# Patient Record
Sex: Male | Born: 2000
Health system: Southern US, Community
[De-identification: ages and names within clinical notes are randomized; demographics above are authoritative.]

## PROBLEM LIST (undated history)

## (undated) DIAGNOSIS — J45909 Unspecified asthma, uncomplicated: Secondary | ICD-10-CM

## (undated) HISTORY — PX: CIRCUMCISION: SUR203

---
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
## 2018-01-27 IMAGING — DX DG HAND COMPLETE 3+V*R*
3 series · 3 of 3 positions shown · non-contrast
Comparison: None.

CLINICAL DATA: Pain and swelling after punching wall

EXAM:
RIGHT HAND - COMPLETE 3+ VIEW

[hand pa]
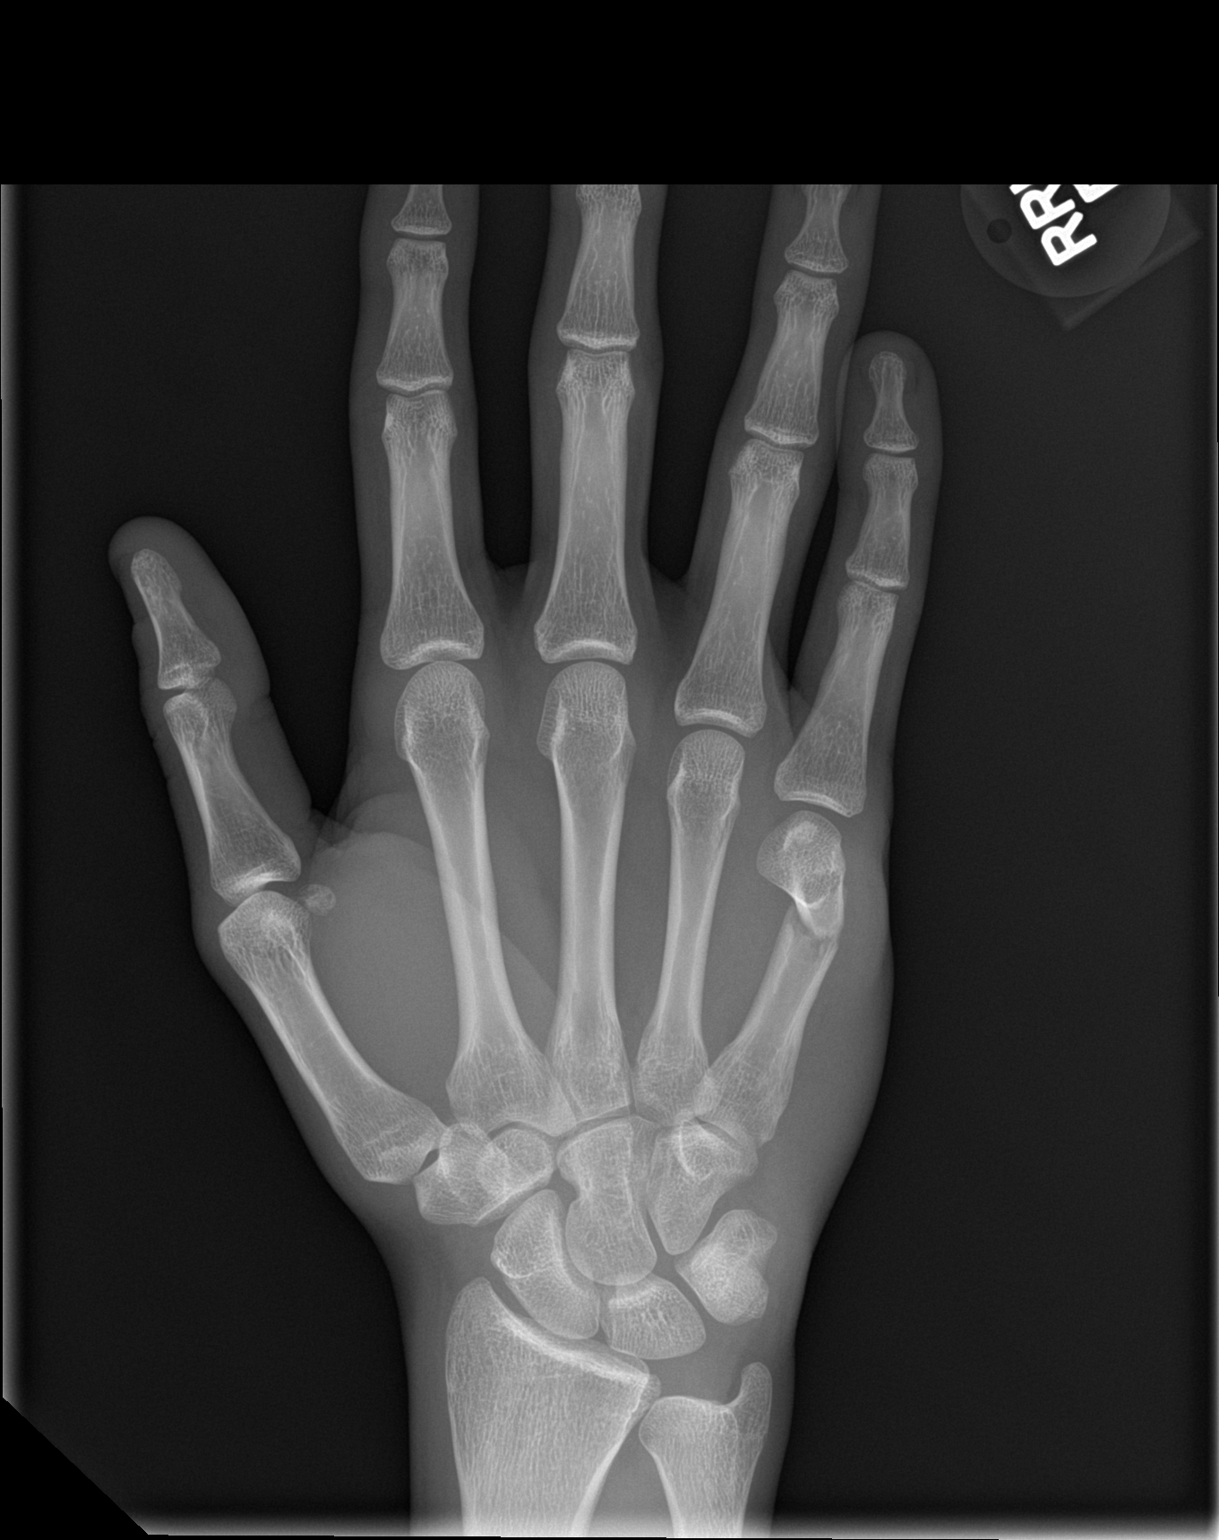

[hand obl]
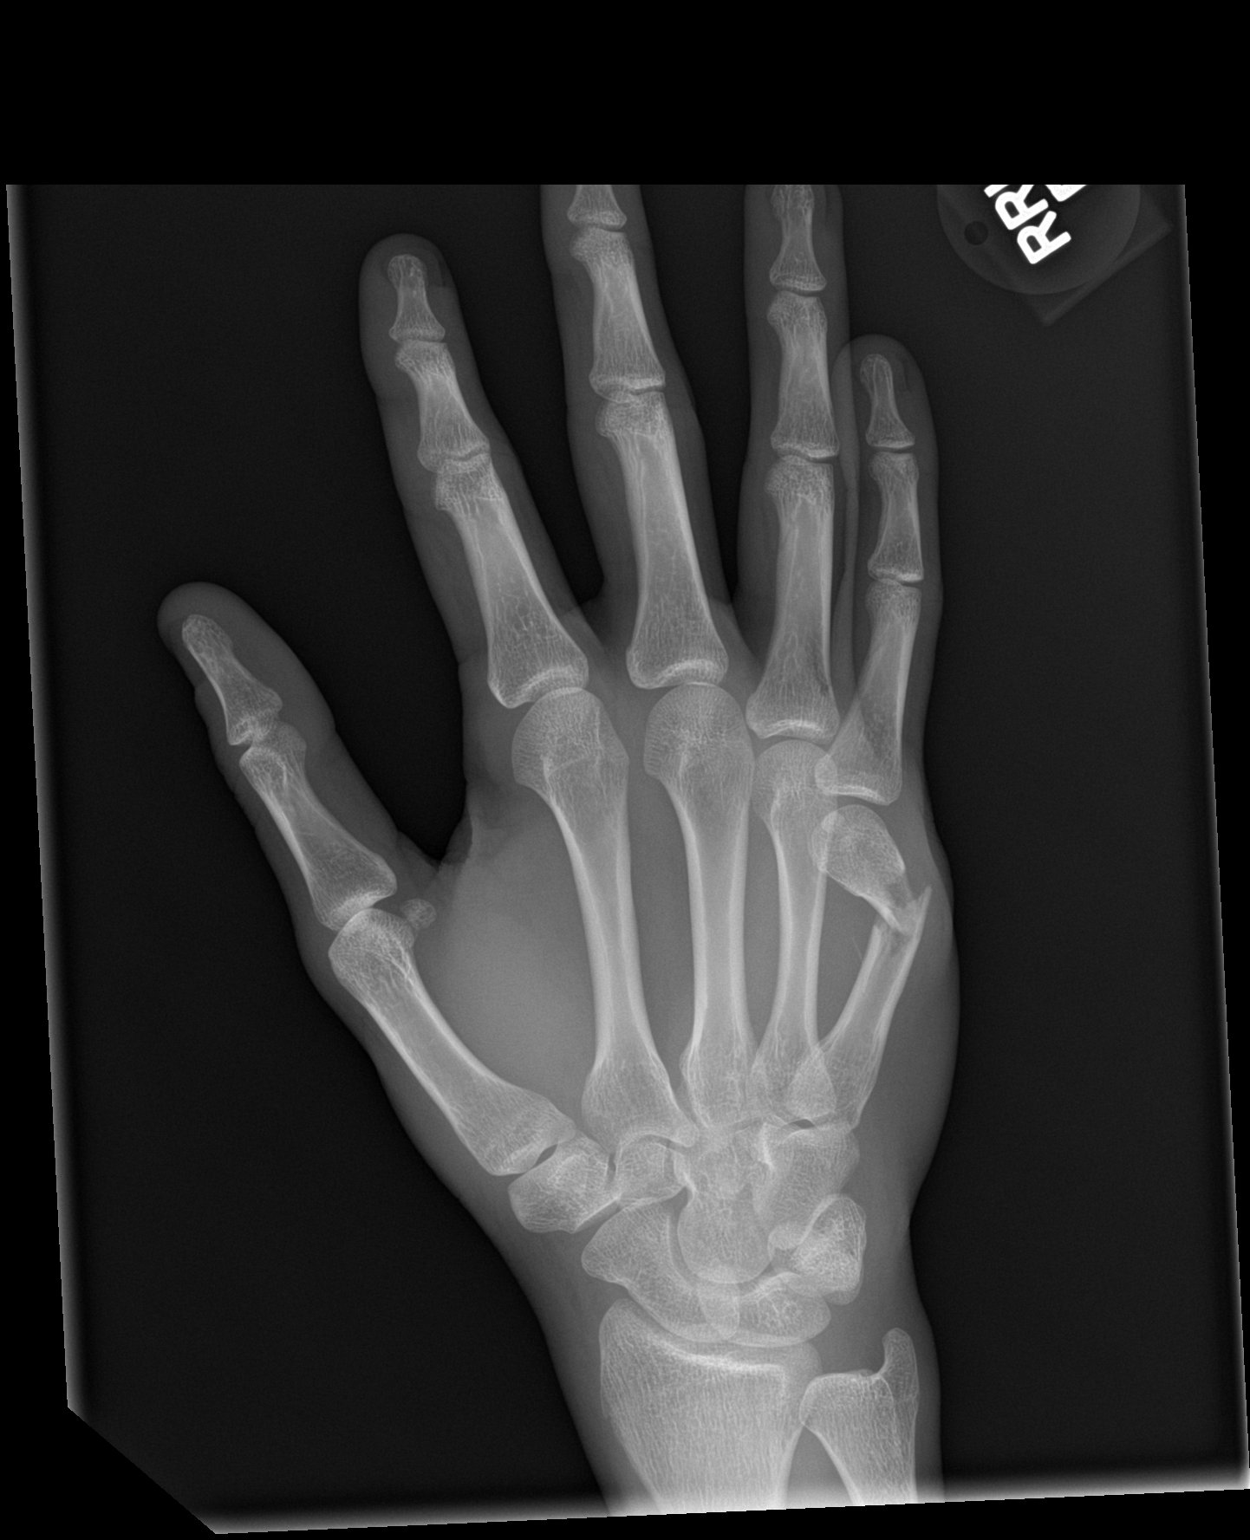

[hand lat]
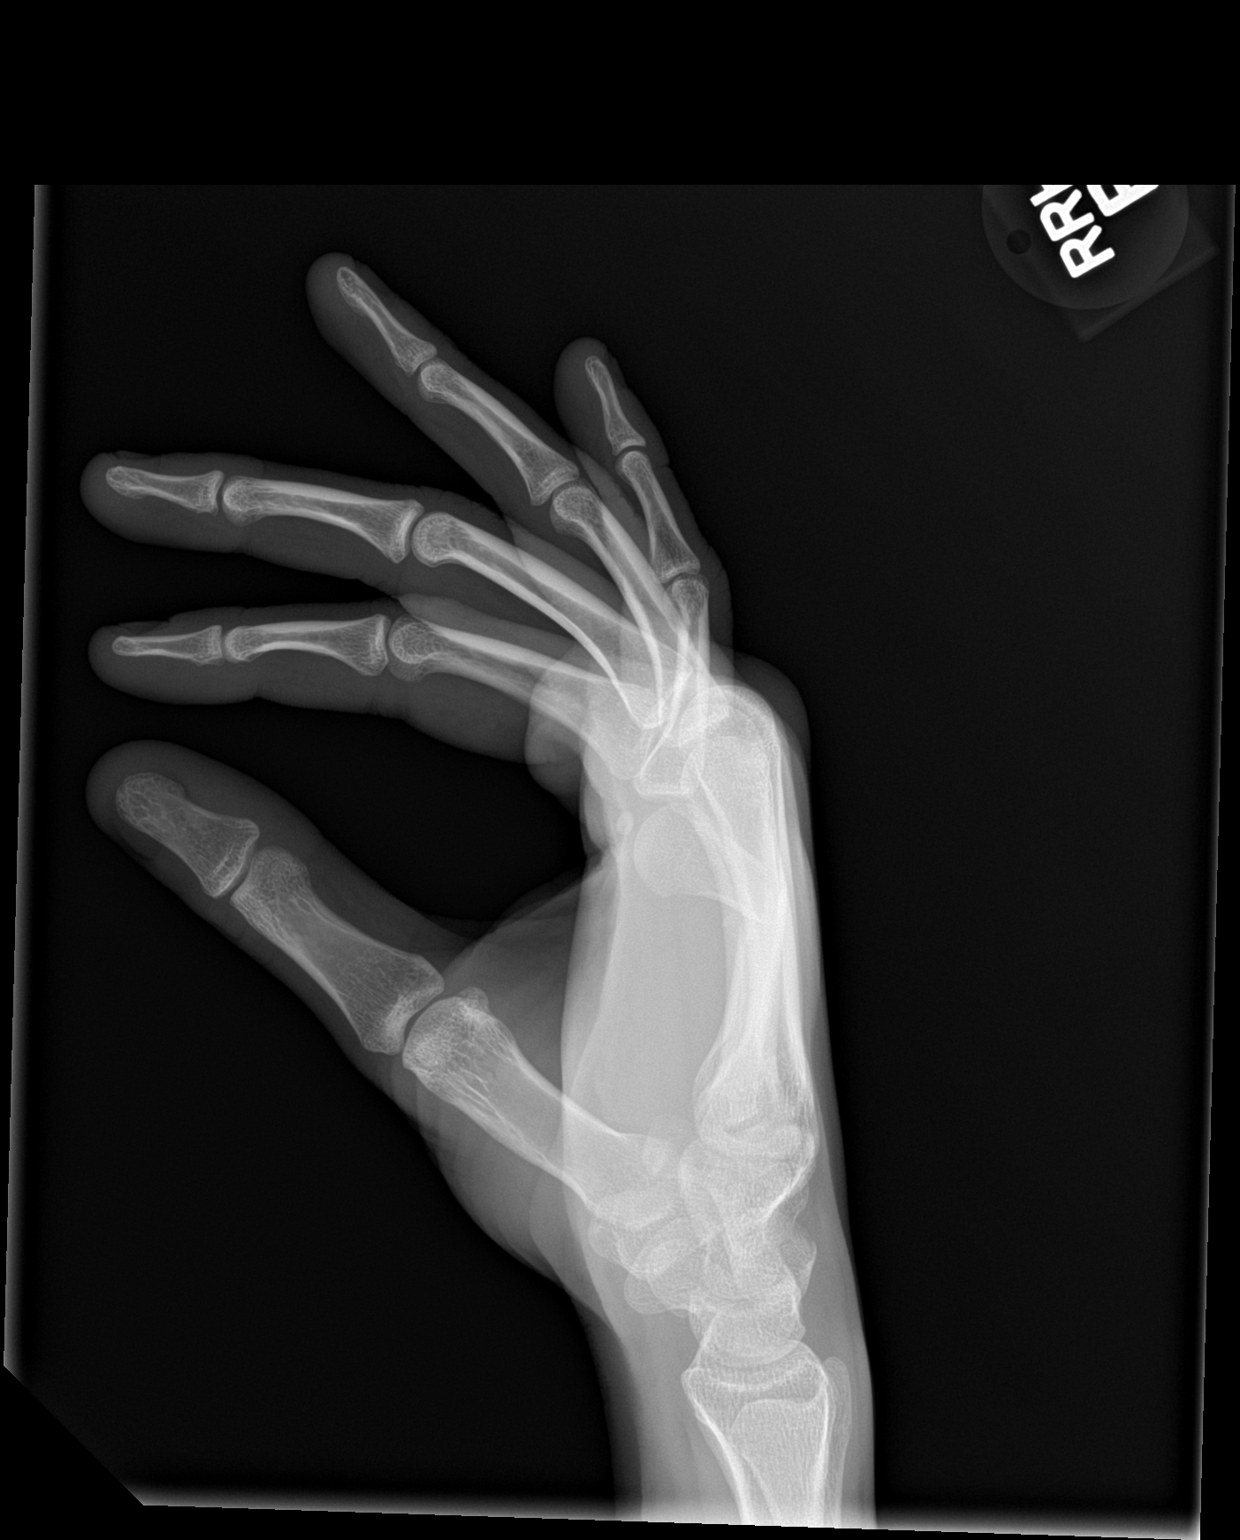

[3 of 3 positions shown; findings below may reference images not displayed]

FINDINGS: Frontal, oblique and lateral views were obtained. There is a
fracture of the distal fifth metacarpal diaphysis with volar
angulation distally. No other fractures. No dislocations. The joint
spaces appear normal. No erosive change.
IMPRESSION: Fracture distal fifth metacarpal with volar angulation distally. No
other fracture. No dislocation. No apparent arthropathy.

## 2018-09-27 DIAGNOSIS — Z0001 Encounter for general adult medical examination with abnormal findings: Secondary | ICD-10-CM | POA: Diagnosis not present

## 2018-09-27 DIAGNOSIS — Z1389 Encounter for screening for other disorder: Secondary | ICD-10-CM | POA: Diagnosis not present

## 2018-09-27 DIAGNOSIS — R634 Abnormal weight loss: Secondary | ICD-10-CM | POA: Diagnosis not present

## 2018-09-27 DIAGNOSIS — Z713 Dietary counseling and surveillance: Secondary | ICD-10-CM | POA: Diagnosis not present

## 2021-02-17 ENCOUNTER — Encounter: Payer: Self-pay | Admitting: Emergency Medicine

## 2021-02-17 ENCOUNTER — Other Ambulatory Visit: Payer: Self-pay

## 2021-02-17 ENCOUNTER — Ambulatory Visit
Admission: EM | Admit: 2021-02-17 | Discharge: 2021-02-17 | Disposition: A | Discharge status: Home / Self Care | Payer: 59 | Attending: Family Medicine | Admitting: Family Medicine

## 2021-02-17 DIAGNOSIS — D2322 Other benign neoplasm of skin of left ear and external auricular canal: Secondary | ICD-10-CM | POA: Diagnosis not present
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
## 2021-02-25 ENCOUNTER — Encounter: Payer: Self-pay | Admitting: Nurse Practitioner

## 2021-02-25 ENCOUNTER — Other Ambulatory Visit: Payer: Self-pay

## 2021-02-25 ENCOUNTER — Ambulatory Visit (INDEPENDENT_AMBULATORY_CARE_PROVIDER_SITE_OTHER): Payer: 59 | Admitting: Nurse Practitioner

## 2021-02-25 VITALS — BP 130/84 | Resp 16 | Ht 73.75 in | Wt 136.4 lb

## 2021-02-25 DIAGNOSIS — H9392 Unspecified disorder of left ear: Secondary | ICD-10-CM | POA: Diagnosis not present

## 2021-02-25 DIAGNOSIS — Z139 Encounter for screening, unspecified: Secondary | ICD-10-CM | POA: Diagnosis not present

## 2021-02-25 DIAGNOSIS — Z0000 Encounter for general adult medical examination without abnormal findings: Secondary | ICD-10-CM | POA: Insufficient documentation

## 2021-02-25 DIAGNOSIS — Z7689 Persons encountering health services in other specified circumstances: Secondary | ICD-10-CM

## 2021-02-25 NOTE — Progress Notes (Signed)
New Patient Office Visit  Subjective:  Patient ID: Frank Dickerson, male    DOB: 03/12/2001  Age: 19 y.o. MRN: [id number]  CC:  Chief Complaint  Patient presents with  . Establish Care  . Mass    Dime sized bump behind left ear. Sometimes it is sore if he sleeps on it. Got big as a ping pong ball last week and popped with pus. Still feels like something is in there     HPI Frank Dickerson presents for left ear lesion. He needs referral to ENT so he can go to his appt on 03/07/21. He had mass behind his ear that started draining, and he states it had thick pus and some blood.   No recent PCP. No recent labs or physical.     Past Medical History:  Diagnosis Date  . Asthma    childhood    Past Surgical History:  Procedure Laterality Date  . CIRCUMCISION      Family History  Problem Relation Age of Onset  . Migraines Mother   . Bipolar disorder Sister   . ADD / ADHD Sister   . ADD / ADHD Brother     Social History   Socioeconomic History  . Marital status: Married    Spouse name: Not on file  . Number of children: Not on file  . Years of education: Not on file  . Highest education level: Not on file  Occupational History  . Occupation: Unemployed  Tobacco Use  . Smoking status: Current Every Day Smoker    Types: E-cigarettes  . Smokeless tobacco: Never Used  Vaping Use  . Vaping Use: Every day  . Substances: Nicotine, Flavoring  Substance and Sexual Activity  . Alcohol use: No  . Drug use: No  . Sexual activity: Yes    Birth control/protection: None    Comment: wife is currently pregnant  Other Topics Concern  . Not on file  Social History Narrative  . Not on file   Social Determinants of Health   Financial Resource Strain: Not on file  Food Insecurity: Not on file  Transportation Needs: Not on file  Physical Activity: Not on file  Stress: Not on file  Social Connections: Not on file  Intimate Partner Violence: Not on file     ROS Review of Systems  Constitutional: Negative.   HENT: Positive for ear pain.        Left ear lesion  Respiratory: Negative.   Cardiovascular: Negative.   Psychiatric/Behavioral: Negative.     Objective:   Today's Vitals: BP 130/84   Resp 16   Ht 6' 1.75" (1.873 m)   Wt 136 lb 6.4 oz (61.9 kg)   SpO2 99%   BMI 17.63 kg/m   Physical Exam Constitutional:      Appearance: Normal appearance.  Cardiovascular:     Rate and Rhythm: Normal rate and regular rhythm.     Pulses: Normal pulses.     Heart sounds: Normal heart sounds.  Pulmonary:     Effort: Pulmonary effort is normal.     Breath sounds: Normal breath sounds.  Skin:    General: Skin is warm and dry.     Findings: Lesion present.     Comments: Erythematous lesion to posterior of left ear. Since it has been drained, difficult to determine if this was abscess or sebaceous cyst.   Neurological:     Mental Status: He is alert.     Assessment & Plan:  Problem List Items Addressed This Visit      Nervous and Auditory   Lesion of left ear    -has been drained; unsure if abscess, sebaceous cyst, or other lesion -has ENT appointment on 03/07/21, but he states he needs referral for this -will grant that request      Relevant Orders   Ambulatory referral to ENT     Other   Encounter to establish care    -no recent PCP, physical, or labs      Relevant Orders   CBC with Differential/Platelet   CMP14+EGFR   HCV Ab w/Rflx to Verification   HIV Antibody (routine testing w rflx)   Lipid Panel With LDL/HDL Ratio    Other Visit Diagnoses    Screening due    -  Primary   Relevant Orders   HCV Ab w/Rflx to Verification   HIV Antibody (routine testing w rflx)      Outpatient Encounter Medications as of 02/25/2021  Medication Sig  . [DISCONTINUED] HYDROcodone-acetaminophen (NORCO/VICODIN) 5-325 MG tablet Take 1 tablet by mouth every 4 (four) hours as needed. (Patient not taking: Reported on 02/25/2021)  .  [DISCONTINUED] ibuprofen (ADVIL,MOTRIN) 600 MG tablet Take 1 tablet (600 mg total) by mouth every 6 (six) hours as needed.   No facility-administered encounter medications on file as of 02/25/2021.    Follow-up: Return in about 1 month (around 03/28/2021) for Physical Exam.   Noreene Larsson, NP

## 2021-02-25 NOTE — Assessment & Plan Note (Signed)
-  has been drained; unsure if abscess, sebaceous cyst, or other lesion -has ENT appointment on 03/07/21, but he states he needs referral for this -will grant that request

## 2021-02-25 NOTE — Assessment & Plan Note (Signed)
-  no recent PCP, physical, or labs

## 2021-02-25 NOTE — Patient Instructions (Signed)
Please have fasting labs drawn 2-3 days prior to your appointment so we can discuss lab results during your office visit.

## 2021-03-19 ENCOUNTER — Encounter (INDEPENDENT_AMBULATORY_CARE_PROVIDER_SITE_OTHER): Payer: Self-pay | Admitting: Otolaryngology

## 2021-03-19 ENCOUNTER — Ambulatory Visit (INDEPENDENT_AMBULATORY_CARE_PROVIDER_SITE_OTHER): Payer: 59 | Admitting: Otolaryngology

## 2021-03-19 ENCOUNTER — Other Ambulatory Visit: Payer: Self-pay

## 2021-03-19 VITALS — Temp 97.2°F

## 2021-03-19 DIAGNOSIS — L723 Sebaceous cyst: Secondary | ICD-10-CM | POA: Diagnosis not present

## 2021-03-19 NOTE — Progress Notes (Signed)
HPI: Frank Dickerson is a 20 y.o. male who presents is referred by ED for evaluation of left postauricular cyst.  Apparently patient was seen in the ED about a month ago because of an infected cyst behind his left ear.  He was not treated with antibiotics.  But apparently it did rupture and is doing better presently.  It is not tender..  Past Medical History:  Diagnosis Date  . Asthma    childhood   Past Surgical History:  Procedure Laterality Date  . CIRCUMCISION     Social History   Socioeconomic History  . Marital status: Married    Spouse name: Not on file  . Number of children: Not on file  . Years of education: Not on file  . Highest education level: Not on file  Occupational History  . Occupation: Unemployed  Tobacco Use  . Smoking status: Current Every Day Smoker    Types: E-cigarettes  . Smokeless tobacco: Never Used  Vaping Use  . Vaping Use: Every day  . Substances: Nicotine, Flavoring  Substance and Sexual Activity  . Alcohol use: No  . Drug use: No  . Sexual activity: Yes    Birth control/protection: None    Comment: wife is currently pregnant  Other Topics Concern  . Not on file  Social History Narrative  . Not on file   Social Determinants of Health   Financial Resource Strain: Not on file  Food Insecurity: Not on file  Transportation Needs: Not on file  Physical Activity: Not on file  Stress: Not on file  Social Connections: Not on file   Family History  Problem Relation Age of Onset  . Migraines Mother   . Bipolar disorder Sister   . ADD / ADHD Sister   . ADD / ADHD Brother    Allergies  Allergen Reactions  . Other     Seasonal Allergies   Prior to Admission medications   Not on File     Positive ROS: Otherwise negative  All other systems have been reviewed and were otherwise negative with the exception of those mentioned in the HPI and as above.  Physical Exam: Constitutional: Alert, well-appearing, no acute distress Ears:  External ears without lesions or tenderness. Ear canals are clear bilaterally with intact, clear TMs.  Behind the left ear patient has a small approximate 8 mm cyst in the crease behind the left ear.  This is soft to palpation and not inflamed or swollen.  He has another smaller 1 mm cyst little bit posterior to this that apparently is new according to his wife.  Presently the cyst is soft and not inflamed. Nasal: External nose without lesions. Clear nasal passages Oral: Lips and gums without lesions. Tongue and palate mucosa without lesions. Posterior oropharynx clear. Neck: No palpable adenopathy or masses Respiratory: Breathing comfortably  Skin: No facial/neck lesions or rash noted.  Procedures  Assessment: Postauricular sebaceous cyst  Plan: Discussed with patient that as long as he does not rupture the cyst it will not cause any problems.  If he gets infected where it hurts and comes erythematous I gave him a prescription for either doxycycline 100 mg twice daily or Keflex 500 mg 3 times daily to take. Presently it is not causing him any problems however it enlarges or gets infected again consider possible excision under local anesthesia although this will have to be performed in the OR.   Radene Journey, MD   CC:

## 2021-03-28 ENCOUNTER — Encounter: Payer: 59 | Admitting: Nurse Practitioner

## 2021-04-11 ENCOUNTER — Other Ambulatory Visit: Payer: Self-pay

## 2021-04-11 ENCOUNTER — Encounter: Payer: Self-pay | Admitting: Nurse Practitioner

## 2021-04-11 ENCOUNTER — Ambulatory Visit (INDEPENDENT_AMBULATORY_CARE_PROVIDER_SITE_OTHER): Payer: 59 | Admitting: Nurse Practitioner

## 2021-04-11 VITALS — BP 139/77 | HR 75 | Temp 98.8°F | Resp 18 | Ht 74.0 in | Wt 140.0 lb

## 2021-04-11 DIAGNOSIS — Z0000 Encounter for general adult medical examination without abnormal findings: Secondary | ICD-10-CM

## 2021-04-11 DIAGNOSIS — Z139 Encounter for screening, unspecified: Secondary | ICD-10-CM | POA: Diagnosis not present

## 2021-04-11 NOTE — Assessment & Plan Note (Signed)
-  PE today was unremarkable

## 2021-04-11 NOTE — Assessment & Plan Note (Signed)
-  will screen for HCV with next labs

## 2021-04-11 NOTE — Progress Notes (Signed)
Established Patient Office Visit  Subjective:  Patient ID: Frank Dickerson, male    DOB: Apr 13, 2001  Age: 20 y.o. MRN: [id number]  CC:  Chief Complaint  Patient presents with  . Annual Exam    Needs fasting labs next week.     HPI Frank Dickerson presents for physical exam. At last OV, he has a cyst to his ear, and he saw ENT who prescribed abx if he has future infections, and discussed possible surgery in the future if the cyst gets larger or infected.  Past Medical History:  Diagnosis Date  . Asthma    childhood  . Migraine without aura and without status migrainosus, not intractable 04/11/2014    Past Surgical History:  Procedure Laterality Date  . CIRCUMCISION      Family History  Problem Relation Age of Onset  . Migraines Mother   . Bipolar disorder Sister   . ADD / ADHD Sister   . ADD / ADHD Brother     Social History   Socioeconomic History  . Marital status: Married    Spouse name: Not on file  . Number of children: Not on file  . Years of education: Not on file  . Highest education level: Not on file  Occupational History  . Occupation: Unemployed  Tobacco Use  . Smoking status: Current Every Day Smoker    Types: E-cigarettes  . Smokeless tobacco: Never Used  Vaping Use  . Vaping Use: Every day  . Substances: Nicotine, Flavoring  Substance and Sexual Activity  . Alcohol use: No  . Drug use: No  . Sexual activity: Yes    Birth control/protection: None    Comment: wife is currently pregnant  Other Topics Concern  . Not on file  Social History Narrative  . Not on file   Social Determinants of Health   Financial Resource Strain: Not on file  Food Insecurity: Not on file  Transportation Needs: Not on file  Physical Activity: Not on file  Stress: Not on file  Social Connections: Not on file  Intimate Partner Violence: Not on file    No outpatient medications prior to visit.   No facility-administered medications prior to visit.     Allergies  Allergen Reactions  . Other     Seasonal Allergies    ROS Review of Systems  Constitutional: Negative.   HENT: Negative.   Eyes: Negative.   Respiratory: Negative.   Cardiovascular: Negative.   Gastrointestinal: Negative.   Endocrine: Negative.   Genitourinary: Negative.   Musculoskeletal: Negative.   Allergic/Immunologic: Negative.   Neurological: Negative.   Hematological: Negative.   Psychiatric/Behavioral: Negative.       Objective:    Physical Exam Constitutional:      Appearance: Normal appearance.  HENT:     Head: Normocephalic and atraumatic.     Right Ear: Ear canal and external ear normal.     Left Ear: Ear canal and external ear normal.     Ears:     Comments: Has bilateral TM scarring    Nose: Nose normal.     Mouth/Throat:     Mouth: Mucous membranes are moist.     Pharynx: Oropharynx is clear.  Eyes:     Extraocular Movements: Extraocular movements intact.     Conjunctiva/sclera: Conjunctivae normal.     Pupils: Pupils are equal, round, and reactive to light.  Cardiovascular:     Rate and Rhythm: Normal rate and regular rhythm.  Pulses: Normal pulses.     Heart sounds: Normal heart sounds.  Pulmonary:     Effort: Pulmonary effort is normal.     Breath sounds: Normal breath sounds.  Abdominal:     General: Abdomen is flat. Bowel sounds are normal.     Palpations: Abdomen is soft.  Musculoskeletal:        General: Normal range of motion.     Cervical back: Normal range of motion and neck supple.  Skin:    General: Skin is warm and dry.     Capillary Refill: Capillary refill takes less than 2 seconds.  Neurological:     General: No focal deficit present.     Mental Status: He is alert and oriented to person, place, and time.     Cranial Nerves: No cranial nerve deficit.     Sensory: No sensory deficit.     Motor: No weakness.     Coordination: Coordination normal.     Gait: Gait normal.  Psychiatric:        Mood and  Affect: Mood normal.        Behavior: Behavior normal.        Thought Content: Thought content normal.        Judgment: Judgment normal.     BP 139/77   Pulse 75   Temp 98.8 F (37.1 C)   Resp 18   Ht _0  (1.88 m)   Wt 140 lb (63.5 kg)   SpO2 98%   BMI 17.97 kg/m  Wt Readings from Last 3 Encounters:  04/11/21 140 lb (63.5 kg)  02/25/21 136 lb 6.4 oz (61.9 kg)  01/18/17 135 lb (61.2 kg) (35 %, Z= -0.38)*   * Growth percentiles are based on CDC (Boys, 2-20 Years) data.     Health Maintenance Due  Topic Date Due  . HIV Screening  Never done  . Hepatitis C Screening  Never done    There are no preventive care reminders to display for this patient.  No results found for: TSH No results found for: WBC, HGB, HCT, MCV, PLT No results found for: NA, K, CHLORIDE, CO2, GLUCOSE, BUN, CREATININE, BILITOT, ALKPHOS, AST, ALT, PROT, ALBUMIN, CALCIUM, ANIONGAP, EGFR, GFR No results found for: CHOL No results found for: HDL No results found for: LDLCALC No results found for: TRIG No results found for: CHOLHDL No results found for: HGBA1C    Assessment & Plan:   Problem List Items Addressed This Visit      Other   Routine medical exam    -PE today was unremarkable      Screening due - Primary    -will screen for HCV with next labs      Relevant Orders   Hepatitis C Antibody      No orders of the defined types were placed in this encounter.   Follow-up: Return in about 1 year (around 04/11/2022) for Physical Exam (same day fasting labs).    Noreene Larsson, NP

## 2021-04-11 NOTE — Patient Instructions (Signed)
Please have fasting labs drawn this week.  For the physical in a year, we will get same day fasting labs.

## 2021-05-17 DIAGNOSIS — M25561 Pain in right knee: Secondary | ICD-10-CM | POA: Diagnosis not present

## 2022-04-17 ENCOUNTER — Encounter: Payer: 59 | Admitting: Nurse Practitioner

## 2024-02-29 DIAGNOSIS — B070 Plantar wart: Secondary | ICD-10-CM | POA: Diagnosis not present

## 2024-02-29 DIAGNOSIS — Q666 Other congenital valgus deformities of feet: Secondary | ICD-10-CM | POA: Diagnosis not present

## 2024-02-29 DIAGNOSIS — Z681 Body mass index (BMI) 19 or less, adult: Secondary | ICD-10-CM | POA: Diagnosis not present

## 2024-03-01 ENCOUNTER — Ambulatory Visit: Admitting: Podiatry

## 2024-03-03 ENCOUNTER — Ambulatory Visit (INDEPENDENT_AMBULATORY_CARE_PROVIDER_SITE_OTHER): Admitting: Podiatry

## 2024-03-03 ENCOUNTER — Encounter: Payer: Self-pay | Admitting: Podiatry

## 2024-03-03 DIAGNOSIS — D492 Neoplasm of unspecified behavior of bone, soft tissue, and skin: Secondary | ICD-10-CM

## 2024-03-03 DIAGNOSIS — D2371 Other benign neoplasm of skin of right lower limb, including hip: Secondary | ICD-10-CM

## 2024-03-03 NOTE — Progress Notes (Signed)
 Subjective:   Patient ID: Frank Dickerson, male   DOB: 23 y.o.   MRN: [id number]   HPI Chief Complaint  Patient presents with   Plantar Warts    RM#13 Right foot wart present for 4-6 weeks causing pain.   23 year old male presents the office with concerns of a skin lesion, wart on the right foot present present for the last 4 to 6 weeks.  He did go to urgent care for this as well.  No recent treatment.  No injuries.  Does not recall stepping any foreign objects.   Review of Systems  All other systems reviewed and are negative.  Past Medical History:  Diagnosis Date   Asthma    childhood   Migraine without aura and without status migrainosus, not intractable 04/11/2014    Past Surgical History:  Procedure Laterality Date   CIRCUMCISION      No current outpatient medications on file.  Allergies  Allergen Reactions   Other     Seasonal Allergies          Objective:  Physical Exam  General: AAO x3, NAD  Dermatological: Punctate annual hyperkeratotic lesion noted on the plantar aspect of the right foot near the heel. There is a punctate, annular hyperkeratotic lesion without any underlying ulceration, drainage, or signs of infection. No foreign body noted.   Vascular: Dorsalis Pedis artery and Posterior Tibial artery pedal pulses are 2/4 bilateral with immedate capillary fill time.  There is no pain with calf compression, swelling, warmth, erythema.   Neruologic: Grossly intact via light touch bilateral.   Musculoskeletal:  Tenderness to palpation on the skin lesion. No other areas of tenderness.   Gait: Unassisted, Nonantalgic.       Assessment:   Skin lesion right foot     Plan:  -Treatment options discussed including all alternatives, risks, and complications -Etiology of symptoms were discussed -Sharply debrided the skin lesion without any complications or bleeding. I cleaned the skin with alcohol. Canthrone applied followed by an occlusive bandage.  Post-procedure instructions discussed. Monitor for any signs of infection  Return in about 4 weeks (around 03/31/2024) for wart.  Vivi Barrack DPM

## 2024-03-03 NOTE — Patient Instructions (Signed)
 Take dressing off in 8 hours and wash the foot with soap and water. If it is hurting or becomes uncomfortable before the 8 hours, go ahead and remove the bandage and wash the area.  If it blisters, apply antibiotic ointment and a band-aid.  Monitor for any signs/symptoms of infection. Call the office immediately if any occur or go directly to the emergency room. Call with any questions/concerns.

## 2024-03-07 ENCOUNTER — Ambulatory Visit: Admitting: Physician Assistant

## 2024-03-28 ENCOUNTER — Ambulatory Visit (INDEPENDENT_AMBULATORY_CARE_PROVIDER_SITE_OTHER): Admitting: Podiatry

## 2024-03-28 ENCOUNTER — Ambulatory Visit (INDEPENDENT_AMBULATORY_CARE_PROVIDER_SITE_OTHER)

## 2024-03-28 DIAGNOSIS — D492 Neoplasm of unspecified behavior of bone, soft tissue, and skin: Secondary | ICD-10-CM

## 2024-03-28 DIAGNOSIS — D2371 Other benign neoplasm of skin of right lower limb, including hip: Secondary | ICD-10-CM

## 2024-03-28 DIAGNOSIS — S90851A Superficial foreign body, right foot, initial encounter: Secondary | ICD-10-CM | POA: Diagnosis not present

## 2024-03-28 NOTE — Progress Notes (Signed)
 Subjective:   Patient ID: Frank Dickerson, male   DOB: 23 y.o.   MRN: [id number]   HPI Chief Complaint  Patient presents with   Plantar Warts    RM#13 RM#11 wart follow up    23 year old male presents the office with concerns of a skin lesion, wart on the right foot present present for the last 4 to 6 weeks.  He did not have any complications after last treatment with Cantharone but is not seeing significant improvement.  There is still tender with pressure.  No drainage or pus.  No recent injuries or other changes.  He does not recall stepping on any foreign objects.    Review of Systems  All other systems reviewed and are negative.   No current outpatient medications on file.       Objective:  Physical Exam  General: AAO x3, NAD  Dermatological: Punctate annual hyperkeratotic lesion noted on the plantar aspect of the right foot near the heel.  There is no hyperpigmentation or foreign body or puncture wound noted today.  No surrounding erythema, ascending cellulitis.  No drainage or pus.  Vascular: Dorsalis Pedis artery and Posterior Tibial artery pedal pulses are 2/4 bilateral with immedate capillary fill time.  There is no pain with calf compression, swelling, warmth, erythema.   Neruologic: Grossly intact via light touch bilateral.   Musculoskeletal:  Tenderness to palpation on the skin lesion. No other areas of tenderness.   Gait: Unassisted, Nonantalgic.       Assessment:   Skin lesion right foot     Plan:  -Treatment options discussed including all alternatives, risks, and complications -Etiology of symptoms were discussed -X-rays were obtained reviewed to rule out foreign body.  3 views were obtained.  Not able to appreciate any evidence of foreign object.  Soft tissue deficit noted from where I debrided the skin lesion. -Sharply debrided the skin lesion without any complications or bleeding. I cleaned the skin with alcohol.  Salicylic acid applied followed by  an occlusive bandage. Post-procedure instructions discussed. Monitor for any signs of infection -If no improvement next couple weeks likely order compound cream through [facility] to help. -Discussed surgical incision if needed.  Return for skin lesion in 2-3 weeks.   Charity Conch DPM

## 2024-03-28 NOTE — Patient Instructions (Signed)
 Keep the bandage on for 24 hours. At that time, remove and clean with soap and water. If it hurts or burns before 24 hours go ahead and remove the bandage and wash with soap and water. Keep the area clean. If there is any blistering cover with antibiotic ointment and a bandage. Monitor for any redness, drainage, or other signs of infection. Call the office if any are to occur. If you have any questions, please call the office at [phone].

## 2024-04-25 ENCOUNTER — Ambulatory Visit: Admitting: Podiatry

## 2024-10-09 ENCOUNTER — Encounter (HOSPITAL_BASED_OUTPATIENT_CLINIC_OR_DEPARTMENT_OTHER): Payer: Self-pay

## 2024-10-09 ENCOUNTER — Ambulatory Visit (HOSPITAL_BASED_OUTPATIENT_CLINIC_OR_DEPARTMENT_OTHER)
Admission: RE | Admit: 2024-10-09 | Discharge: 2024-10-09 | Disposition: A | Discharge status: Home / Self Care | Attending: Family Medicine | Admitting: Family Medicine

## 2024-10-09 VITALS — BP 138/88 | HR 78 | Temp 98.9°F | Resp 18

## 2024-10-09 DIAGNOSIS — L0100 Impetigo, unspecified: Secondary | ICD-10-CM | POA: Diagnosis not present

## 2024-10-09 MED ORDER — CEPHALEXIN 500 MG PO CAPS: 500.0000 mg | ORAL_CAPSULE | Freq: Four times a day (QID) | ORAL | 0 refills | Status: AC

## 2024-10-09 MED ORDER — MUPIROCIN 2 % EX OINT: 1.0000 | TOPICAL_OINTMENT | Freq: Two times a day (BID) | CUTANEOUS | 0 refills | Status: AC

## 2024-10-09 NOTE — ED Triage Notes (Signed)
 Pt c/o of rash to his left armpit right forearm right side of neck and left eyelid., states his son just got over impetigo. Area to left arm pit itches, burns, some drainage. Onset since Thursday. Used neosporin.

## 2024-10-09 NOTE — Discharge Instructions (Signed)
 Treating you for impetigo.  Medications as prescribed.  Follow-up as needed

## 2024-10-09 NOTE — ED Provider Notes (Signed)
 [facility]    CSN: [id number] Arrival date & time: 10/09/24  1719      History   Chief Complaint Chief Complaint  Patient presents with   Abscess    Not really sure what it is have something in my armpit looks like blisters, burns, itching also have something on my eyelid I don't if related - Entered by patient   Rash    HPI Frank Dickerson is a 23 y.o. male.   Pt c/o of rash to his left armpit right forearm right side of neck and left eyelid., states his son just got over impetigo. Area to left arm pit itches, burns, some drainage. Onset since Thursday. Used neosporin.    Abscess Rash   Past Medical History:  Diagnosis Date   Asthma    childhood   Migraine without aura and without status migrainosus, not intractable 04/11/2014    Patient Active Problem List   Diagnosis Date Noted   Screening due 04/11/2021   Lesion of left ear 02/25/2021   Routine medical exam 02/25/2021    Past Surgical History:  Procedure Laterality Date   CIRCUMCISION         Home Medications    Prior to Admission medications   Medication Sig Start Date End Date Taking? Authorizing Provider  cephALEXin (KEFLEX) 500 MG capsule Take 1 capsule (500 mg total) by mouth 4 (four) times daily. 10/09/24  Yes Lakaisha Danish A, FNP  mupirocin ointment (BACTROBAN) 2 % Place 1 Application into the nose 2 (two) times daily for 7 days. 10/09/24 10/16/24 Yes Adah Wilbert LABOR, FNP    Family History Family History  Problem Relation Age of Onset   Migraines Mother    Bipolar disorder Sister    ADD / ADHD Sister    ADD / ADHD Brother     Social History Social History   Tobacco Use   Smoking status: Every Day    Types: E-cigarettes   Smokeless tobacco: Never  Vaping Use   Vaping status: Every Day   Substances: Nicotine, Flavoring  Substance Use Topics   Alcohol use: No   Drug use: No     Allergies   Other   Review of Systems Review of Systems  Skin:  Positive for rash.      Physical Exam Triage Vital Signs ED Triage Vitals  Encounter Vitals Group     BP 10/09/24 1733 138/88     Girls Systolic BP Percentile --      Girls Diastolic BP Percentile --      Boys Systolic BP Percentile --      Boys Diastolic BP Percentile --      Pulse Rate 10/09/24 1733 78     Resp 10/09/24 1733 18     Temp 10/09/24 1733 98.9 F (37.2 C)     Temp Source 10/09/24 1733 Oral     SpO2 10/09/24 1733 97 %     Weight --      Height --      Head Circumference --      Peak Flow --      Pain Score 10/09/24 1730 7     Pain Loc --      Pain Education --      Exclude from Growth Chart --    No data found.  Updated Vital Signs BP 138/88 (BP Location: Right Arm)   Pulse 78   Temp 98.9 F (37.2 C) (Oral)   Resp 18  SpO2 97%   Visual Acuity Right Eye Distance:   Left Eye Distance:   Bilateral Distance:    Right Eye Near:   Left Eye Near:    Bilateral Near:     Physical Exam Constitutional:      Appearance: Normal appearance.  Pulmonary:     Effort: Pulmonary effort is normal.  Musculoskeletal:        General: Normal range of motion.  Skin:    Findings: Rash present.     Comments: Multiple honey crusted lesions to left axilla and 1 to posterior forearm.  1 small lesion to left inner eyelid  Neurological:     Mental Status: He is alert.  Psychiatric:        Mood and Affect: Mood normal.      UC Treatments / Results  Labs (all labs ordered are listed, but only abnormal results are displayed) Labs Reviewed - No data to display  EKG   Radiology No results found.  Procedures Procedures (including critical care time)  Medications Ordered in UC Medications - No data to display  Initial Impression / Assessment and Plan / UC Course  I have reviewed the triage vital signs and the nursing notes.  Pertinent labs & imaging results that were available during my care of the patient were reviewed by me and considered in my medical decision making (see  chart for details).   Impetigo-rash consistent with this especially due to  exposure.  Will treat for impetigo at this time and prescribe mupirocin cream and Keflex based on the rash spreading. Recommend follow-up as needed Final Clinical Impressions(s) / UC Diagnoses   Final diagnoses:  Impetigo     Discharge Instructions      Treating you for impetigo.  Medications as prescribed.  Follow-up as needed   ED Prescriptions     Medication Sig Dispense Auth. Provider   mupirocin ointment (BACTROBAN) 2 % Place 1 Application into the nose 2 (two) times daily for 7 days. 14 g Amoni Scallan A, FNP   cephALEXin (KEFLEX) 500 MG capsule Take 1 capsule (500 mg total) by mouth 4 (four) times daily. 28 capsule Adah Corning A, FNP      PDMP not reviewed this encounter.   Adah Corning LABOR, FNP 10/09/24 [phone]
